# Patient Record
Sex: Female | Born: 1995 | Race: Black or African American | Hispanic: No | Marital: Single | Smoking: Never smoker
Health system: Southern US, Community
[De-identification: ages and names within clinical notes are randomized; demographics above are authoritative.]

## PROBLEM LIST (undated history)

## (undated) DIAGNOSIS — G43909 Migraine, unspecified, not intractable, without status migrainosus: Secondary | ICD-10-CM

---

## 2016-12-10 ENCOUNTER — Encounter (HOSPITAL_COMMUNITY): Payer: Self-pay | Admitting: Emergency Medicine

## 2016-12-10 DIAGNOSIS — Q283 Other malformations of cerebral vessels: Secondary | ICD-10-CM | POA: Insufficient documentation

## 2016-12-10 DIAGNOSIS — G43109 Migraine with aura, not intractable, without status migrainosus: Secondary | ICD-10-CM | POA: Diagnosis not present

## 2016-12-10 DIAGNOSIS — R200 Anesthesia of skin: Secondary | ICD-10-CM | POA: Insufficient documentation

## 2016-12-10 DIAGNOSIS — R51 Headache: Secondary | ICD-10-CM | POA: Diagnosis present

## 2016-12-10 NOTE — ED Triage Notes (Signed)
Pt states she has been having headaches since Tuesday off and on  Pt has hx of migraines and sees a neurologist  Pt states she has been having numbness and tingling in her left arm with this headache and she has never had that before

## 2016-12-11 ENCOUNTER — Emergency Department (HOSPITAL_COMMUNITY)
Admission: EM | Admit: 2016-12-11 | Discharge: 2016-12-11 | Disposition: A | Discharge status: Home / Self Care | Payer: Managed Care, Other (non HMO) | Attending: Emergency Medicine | Admitting: Emergency Medicine

## 2016-12-11 ENCOUNTER — Emergency Department (HOSPITAL_COMMUNITY): Payer: Managed Care, Other (non HMO)

## 2016-12-11 DIAGNOSIS — R200 Anesthesia of skin: Secondary | ICD-10-CM

## 2016-12-11 DIAGNOSIS — R519 Headache, unspecified: Secondary | ICD-10-CM

## 2016-12-11 DIAGNOSIS — R51 Headache: Secondary | ICD-10-CM

## 2016-12-11 DIAGNOSIS — Q283 Other malformations of cerebral vessels: Secondary | ICD-10-CM

## 2016-12-11 DIAGNOSIS — G43109 Migraine with aura, not intractable, without status migrainosus: Secondary | ICD-10-CM

## 2016-12-11 HISTORY — DX: Migraine, unspecified, not intractable, without status migrainosus: G43.909

## 2016-12-11 LAB — CBC WITH DIFFERENTIAL/PLATELET
BASOS PCT: 0 %
Basophils Absolute: 0 10*3/uL (ref 0.0–0.1)
EOS ABS: 0.1 10*3/uL (ref 0.0–0.7)
EOS PCT: 1 %
HCT: 35 % — ABNORMAL LOW (ref 36.0–46.0)
Hemoglobin: 12.5 g/dL (ref 12.0–15.0)
LYMPHS ABS: 4.6 10*3/uL — AB (ref 0.7–4.0)
Lymphocytes Relative: 43 %
MCH: 28.7 pg (ref 26.0–34.0)
MCHC: 35.7 g/dL (ref 30.0–36.0)
MCV: 80.3 fL (ref 78.0–100.0)
MONOS PCT: 7 %
Monocytes Absolute: 0.7 10*3/uL (ref 0.1–1.0)
Neutro Abs: 5.2 10*3/uL (ref 1.7–7.7)
Neutrophils Relative %: 49 %
PLATELETS: 234 10*3/uL (ref 150–400)
RBC: 4.36 MIL/uL (ref 3.87–5.11)
RDW: 13.9 % (ref 11.5–15.5)
WBC: 10.6 10*3/uL — ABNORMAL HIGH (ref 4.0–10.5)

## 2016-12-11 LAB — BASIC METABOLIC PANEL
Anion gap: 8 (ref 5–15)
BUN: 14 mg/dL (ref 6–20)
CALCIUM: 8.6 mg/dL — AB (ref 8.9–10.3)
CO2: 23 mmol/L (ref 22–32)
CREATININE: 0.76 mg/dL (ref 0.44–1.00)
Chloride: 106 mmol/L (ref 101–111)
Glucose, Bld: 77 mg/dL (ref 65–99)
Potassium: 3.8 mmol/L (ref 3.5–5.1)
SODIUM: 137 mmol/L (ref 135–145)

## 2016-12-11 LAB — POC URINE PREG, ED: Preg Test, Ur: NEGATIVE

## 2016-12-11 MED ORDER — SODIUM CHLORIDE 0.9 % IV BOLUS (SEPSIS)
1000.0000 mL | Freq: Once | INTRAVENOUS | Status: AC
  Administered 2016-12-11: 1000 mL via INTRAVENOUS

## 2016-12-11 MED ORDER — GADOBENATE DIMEGLUMINE 529 MG/ML IV SOLN
20.0000 mL | Freq: Once | INTRAVENOUS | Status: AC | PRN
  Administered 2016-12-11: 18 mL via INTRAVENOUS

## 2016-12-11 MED ORDER — DIPHENHYDRAMINE HCL 50 MG/ML IJ SOLN
25.0000 mg | Freq: Once | INTRAMUSCULAR | Status: AC
  Administered 2016-12-11: 25 mg via INTRAVENOUS
  Filled 2016-12-11: qty 1

## 2016-12-11 MED ORDER — METOCLOPRAMIDE HCL 5 MG/ML IJ SOLN
10.0000 mg | Freq: Once | INTRAMUSCULAR | Status: AC
  Administered 2016-12-11: 10 mg via INTRAVENOUS
  Filled 2016-12-11: qty 2

## 2016-12-11 NOTE — Discharge Instructions (Signed)
You were seen today for headache and numbness. Your MRI shows a benign cavernoma which is likely not related to your symptoms. Your symptoms likely are related to a complicated migraine. Follow-up with your neurologist.

## 2016-12-11 NOTE — ED Notes (Signed)
Pt taken to MRI  

## 2016-12-11 NOTE — ED Provider Notes (Signed)
  Patient presents as transfer for [facility] for MRI. Treated for complicated migraine but had some arm numbness. CT scan showed increased attenuation in deep white matter. Recommended MRI. On my evaluation, patient reports her headache is improved. Currently 4 out of 10. She is otherwise nontoxic-appearing.  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 Likely without clinical significance. I discussed this with the patient. Feel her symptoms are consistent with, gated migraine. She feels much better. Will discharge home with neurology follow-up.  Physical Exam  BP 130/76   Pulse 76   Temp 98.7 F (37.1 C)   Resp 18   Ht  (1.626 m)   Wt 89.1 kg (196 lb 6.4 oz)   SpO2 100%   BMI 33.71 kg/m   Physical Exam  ABC's intact, nontoxic-appearing   ED Course  Procedures        Shon Baton, MD 12/11/16 [phone]

## 2016-12-11 NOTE — ED Notes (Signed)
Patient picked up by [facility] and taken to [facility] ED.

## 2016-12-11 NOTE — ED Provider Notes (Signed)
TIME SEEN: 1:55 AM  CHIEF COMPLAINT: headache, left arm numbness  HPI: Pt is a 21 year old female with history of migraine headaches who presents emergency department with headache. She states she has had headache that started on Tuesday 12/04/16 and has been intermittent. Described as left-sided, throbbing with intermittent sharp pains that is typical of her migraines. She has photophobia and nausea. She takes sumatriptan and but has not had relief with this medication. She is followed by Dr. Theresia Majors with neurology ([facility] in [place]).  States she just recently had an appointment with him. She reports that on Friday 12/07/16 she began having tingling in her left arm that has been intermittent. It has been constant for the past 6 hours. She feels like this arm is numb. She has never had focal neurologic deficits with her migraines before. No family history of brain tumor, MS, strokes migraines. Mother does have a history of cluster headaches. She denies head injury. Not on antiplatelet or anticoagulants. No focal weakness. No vision changes. No changes in her speech.  ROS: See HPI Constitutional: no fever  Eyes: no drainage  ENT: no runny nose   Cardiovascular:  no chest pain  Resp: no SOB  GI: no vomiting GU: no dysuria Integumentary: no rash  Allergy: no hives  Musculoskeletal: no leg swelling  Neurological: no slurred speech ROS otherwise negative  PAST MEDICAL HISTORY/PAST SURGICAL HISTORY:  Past Medical History:  Diagnosis Date  . Migraine headache     MEDICATIONS:  Prior to Admission medications   Not on File    ALLERGIES:  No Known Allergies  SOCIAL HISTORY:  Social History  Substance Use Topics  . Smoking status: Never Smoker  . Smokeless tobacco: Never Used  . Alcohol use Yes     Comment: rare    FAMILY HISTORY: Family History  Problem Relation Age of Onset  . Hypertension Mother   . Migraines Mother   . Hypertension Father   . Hypertension Other      EXAM: BP 133/73 (BP Location: Right Arm)   Pulse 86   Temp 98.6 F (37 C) (Oral)   Resp 18   Ht  (1.626 m)   Wt 89.1 kg (196 lb 6.4 oz)   SpO2 100%   BMI 33.71 kg/m  CONSTITUTIONAL: Alert and oriented and responds appropriately to questions. Well-appearing; well-nourished HEAD: Normocephalic EYES: Conjunctivae clear, pupils appear equal, EOMI ENT: normal nose; moist mucous membranes NECK: Supple, no meningismus, no nuchal rigidity, no LAD  CARD: RRR; S1 and S2 appreciated; no murmurs, no clicks, no rubs, no gallops RESP: Normal chest excursion without splinting or tachypnea; breath sounds clear and equal bilaterally; no wheezes, no rhonchi, no rales, no hypoxia or respiratory distress, speaking full sentences ABD/GI: Normal bowel sounds; non-distended; soft, non-tender, no rebound, no guarding, no peritoneal signs, no hepatosplenomegaly BACK:  The back appears normal and is non-tender to palpation, there is no CVA tenderness EXT: Normal ROM in all joints; non-tender to palpation; no edema; normal capillary refill; no cyanosis, no calf tenderness or swelling, compartment soft, 2+ radial pulses bilaterally    SKIN: Normal color for age and race; warm; no rash NEURO: Moves all extremities equally, strength 5/5 in all 4 services, no dysmetria to finger-nose testing bilaterally, cranial nerves II through XII intact, normal speech, diminished sensation throughout the entire left arm compared to the right, normal sensation in both legs PSYCH: The patient's mood and manner are appropriate. Grooming and personal hygiene are appropriate.  MEDICAL DECISION MAKING:  Pt here with what is likely a complicated migraine. Will treat with Reglan, Benadryl and IV fluids. Given new neurologic deficits will obtain a head CT. Low suspicion for intracranial hemorrhage. Differential does include stroke and mass.  Not a TPA candidate given her NIH stroke scale would only be 1 and she has had symptoms for  over 6 hours.  ED PROGRESS: Pt reports headache and numbness improving after Reglan, Benadryl IV fluids. Head CT shows focus of vague increased attenuation in the deep white matter along the posterior horn of the left lateral ventricle. Recommend MRI for further evaluation. Patient will need to be transferred to [facility]. She is comfortable with this plan.    3:00 AM  D/w radiologist Dr. Andria Meuse.  He recommends an MRI of her brain with and without contrast. He states he does not think this is hemorrhage but could be a mass or infarct.   3:10 AM  D/w Dr. Wilkie Aye, EDP at [facility].  She agrees to accept patient in transfer. If MRI is negative, patient will be discharged home with follow-up with her neurologist at [facility] in [place].   I reviewed all nursing notes, vitals, pertinent previous records, EKGs, lab and urine results, imaging (as available).     Lael Pilch, Layla Maw, DO 12/11/16 [phone]

## 2016-12-11 NOTE — ED Notes (Signed)
Patient states she went to see her neurologist on Friday and had numbness going down her left arm. Patient states that neurologist stated if it comes back that she needed to come to the ED. The patient stated it came back today.

## 2016-12-11 NOTE — ED Notes (Signed)
Pt arrived by [facility] for MRI. Pt reports numbness in L arm, headache has improved with medication.

## 2017-03-05 ENCOUNTER — Other Ambulatory Visit: Payer: Self-pay

## 2017-03-05 ENCOUNTER — Encounter (HOSPITAL_COMMUNITY): Payer: Self-pay | Admitting: Emergency Medicine

## 2017-03-05 ENCOUNTER — Ambulatory Visit (HOSPITAL_COMMUNITY)
Admission: EM | Admit: 2017-03-05 | Discharge: 2017-03-05 | Disposition: A | Discharge status: Home / Self Care | Payer: Managed Care, Other (non HMO) | Attending: Internal Medicine | Admitting: Internal Medicine

## 2017-03-05 DIAGNOSIS — J029 Acute pharyngitis, unspecified: Secondary | ICD-10-CM

## 2017-03-05 DIAGNOSIS — R51 Headache: Secondary | ICD-10-CM

## 2017-03-05 DIAGNOSIS — J069 Acute upper respiratory infection, unspecified: Secondary | ICD-10-CM | POA: Diagnosis not present

## 2017-03-05 MED ORDER — KETOROLAC TROMETHAMINE 60 MG/2ML IM SOLN
60.0000 mg | Freq: Once | INTRAMUSCULAR | Status: AC
  Administered 2017-03-05: 60 mg via INTRAMUSCULAR

## 2017-03-05 MED ORDER — KETOROLAC TROMETHAMINE 60 MG/2ML IM SOLN
INTRAMUSCULAR | Status: AC
  Filled 2017-03-05: qty 2

## 2017-03-05 NOTE — Discharge Instructions (Signed)
Push fluids to ensure adequate hydration and keep secretions thin.  Tylenol and/or ibuprofen as needed for pain or fevers. Do not take additional ibuprofen until approximately 8:30 tonight. Continue with over the counter treatments such as throat lozenges, cold liquids, honey tea etc as needed for sore throat. If symptoms worsen or do not improve in the next week to return to be seen or to follow up with PCP.

## 2017-03-05 NOTE — ED Triage Notes (Signed)
Pt c/o flu like symptoms body aches, HA, sore throat for a couple days,

## 2017-03-05 NOTE — ED Provider Notes (Signed)
[facility]    CSN: [id number] Arrival date & time: 03/05/17  1344     History   Chief Complaint Chief Complaint  Patient presents with  . Generalized Body Aches    HPI Deborah Valenzuela is a 22 y.o. female.   Deborah Valenzuela presents with complaints of body ahces, sore throat and headache which started two days ago. Without fevers. Mild cough. Without runny nose, ear pain, gi/gu complaints. No known ill contacts. Pain is 7/ 10. Took tylenol two days ago which did not help. Did not get her flu shot this year. Without rash. History migraines, without other medical history.   ROS per HPI.       Past Medical History:  Diagnosis Date  . Migraine headache     There are no active problems to display for this patient.   History reviewed. No pertinent surgical history.  OB History    No data available       Home Medications    Prior to Admission medications   Medication Sig Start Date End Date Taking? Authorizing Provider  etonogestrel (IMPLANON) 68 MG IMPL implant 1 each by Subdermal route once.    [provider]  ondansetron (ZOFRAN) 4 MG tablet Take 1-2 tablets by mouth every 8 (eight) hours as needed for nausea.  12/03/16   [provider]  SUMAtriptan (IMITREX) 50 MG tablet Take 1 tablet by mouth every 2 (two) hours as needed for migraine.  12/03/16   [provider]    Family History Family History  Problem Relation Age of Onset  . Hypertension Mother   . Migraines Mother   . Hypertension Father   . Hypertension Other     Social History Social History   Tobacco Use  . Smoking status: Never Smoker  . Smokeless tobacco: Never Used  Substance Use Topics  . Alcohol use: Yes    Comment: rare  . Drug use: No     Allergies   Patient has no known allergies.   Review of Systems Review of Systems   Physical Exam Triage Vital Signs ED Triage Vitals  Enc Vitals Group     BP 03/05/17 1354 140/73     Pulse Rate 03/05/17  1354 100     Resp 03/05/17 1354 18     Temp 03/05/17 1354 98.4 F (36.9 C)     Temp src --      SpO2 03/05/17 1354 100 %     Weight --      Height --      Head Circumference --      Peak Flow --      Pain Score 03/05/17 1355 7     Pain Loc --      Pain Edu? --      Excl. in GC? --    No data found.  Updated Vital Signs BP 140/73   Pulse 100   Temp 98.4 F (36.9 C)   Resp 18   SpO2 100%   Visual Acuity Right Eye Distance:   Left Eye Distance:   Bilateral Distance:    Right Eye Near:   Left Eye Near:    Bilateral Near:     Physical Exam  Constitutional: She is oriented to person, place, and time. She appears well-developed and well-nourished. No distress.  HENT:  Head: Normocephalic and atraumatic.  Right Ear: Tympanic membrane, external ear and ear canal normal.  Left Ear: Tympanic membrane, external ear and ear canal normal.  Nose:  Nose normal.  Mouth/Throat: Uvula is midline, oropharynx is clear and moist and mucous membranes are normal. No tonsillar exudate.  Eyes: Conjunctivae and EOM are normal. Pupils are equal, round, and reactive to light.  Cardiovascular: Normal rate, regular rhythm and normal heart sounds.  Pulmonary/Chest: Effort normal and breath sounds normal.  Neurological: She is alert and oriented to person, place, and time.  Skin: Skin is warm and dry.     UC Treatments / Results  Labs (all labs ordered are listed, but only abnormal results are displayed) Labs Reviewed - No data to display  EKG  EKG Interpretation None       Radiology No results found.  Procedures Procedures (including critical care time)  Medications Ordered in UC Medications  ketorolac (TORADOL) injection 60 mg (not administered)     Initial Impression / Assessment and Plan / UC Course  I have reviewed the triage vital signs and the nursing notes.  Pertinent labs & imaging results that were available during my care of the patient were reviewed by me and  considered in my medical decision making (see chart for details).     Without acute findings on physical exam. Non toxic in appearance. Vitals stable. Continue with supportive cares for likely viral illness. toradol given in clinic today for pain. If symptoms worsen or do not improve in the next week to return to be seen or to follow up with PCP. Patient verbalized understanding and agreeable to plan.    Final Clinical Impressions(s) / UC Diagnoses   Final diagnoses:  Viral upper respiratory tract infection    ED Discharge Orders    None       Controlled Substance Prescriptions [place] Controlled Substance Registry consulted? Not Applicable   Georgetta Haber, NP 03/05/17 1417

## 2018-08-23 IMAGING — CT CT HEAD W/O CM
3 of 4 series · 15 of 47 positions shown, 18 images · non-contrast
Comparison: None.

ADDENDUM:
Results were discussed with Dr. Lefkowitz following the procedure with
recommendation for MRI with and without contrast material.
CLINICAL DATA: Left-sided headache for 1 week. Numbness and
tingling in the left arm.

EXAM:
CT HEAD WITHOUT CONTRAST
TECHNIQUE: Contiguous axial images were obtained from the base of the skull
through the vertex without intravenous contrast.

[Series 2: head w/o · axial · non-contrast · 0.45mm/px · z∈[-207,-72]mm · 9 of 33 slices shown, 12 images]
[im 3/33  brain]
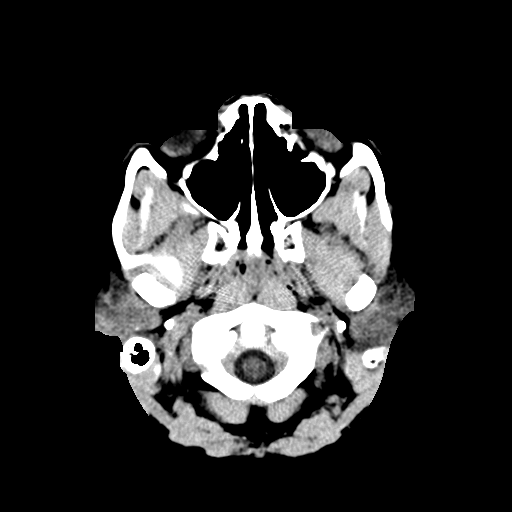
[im 3/33  bone]
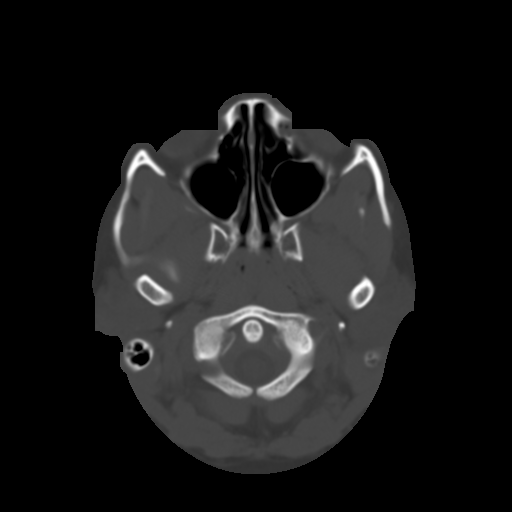
[im 7/33  brain]
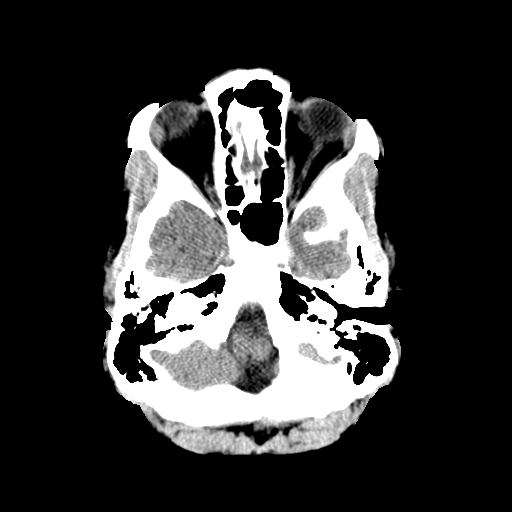
[im 10/33  brain]
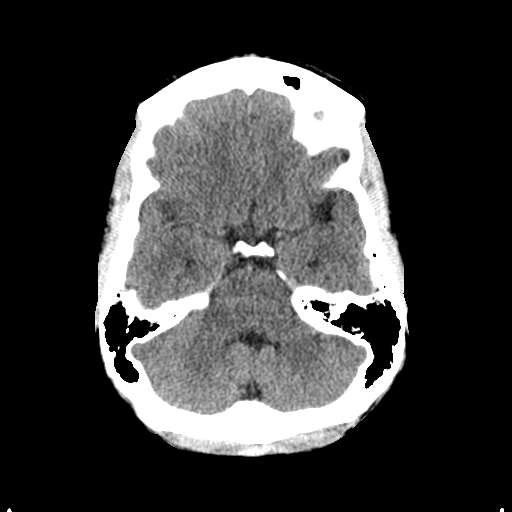
[im 14/33  brain]
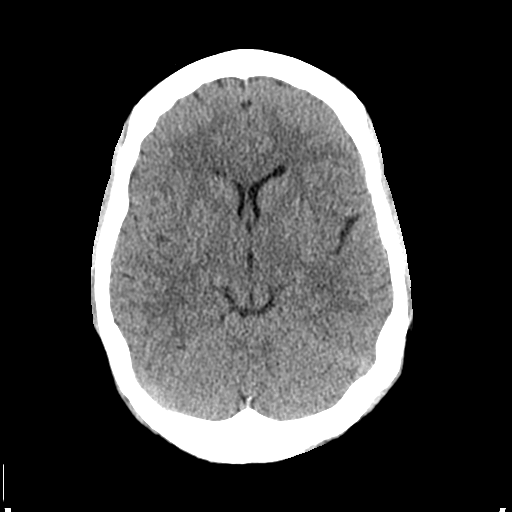
[im 17/33  brain]
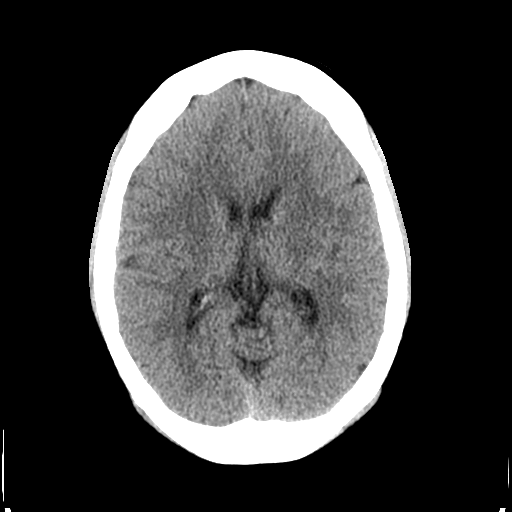
[im 17/33  bone]
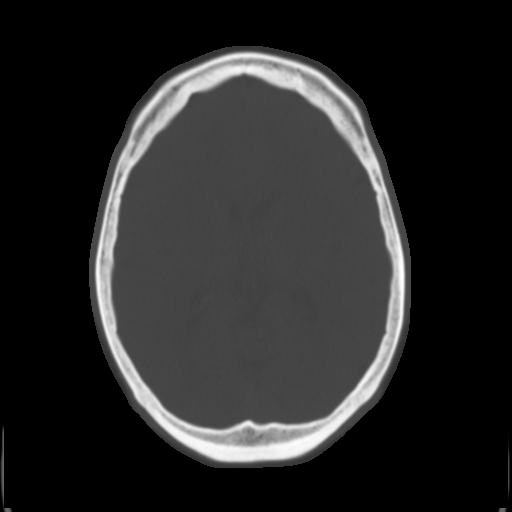
[im 19/33  brain]
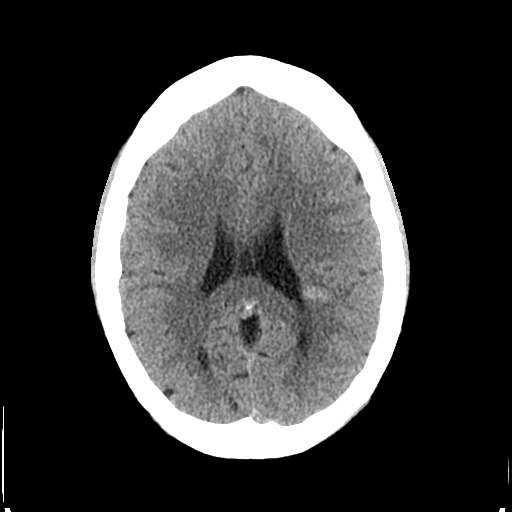
[im 23/33  brain]
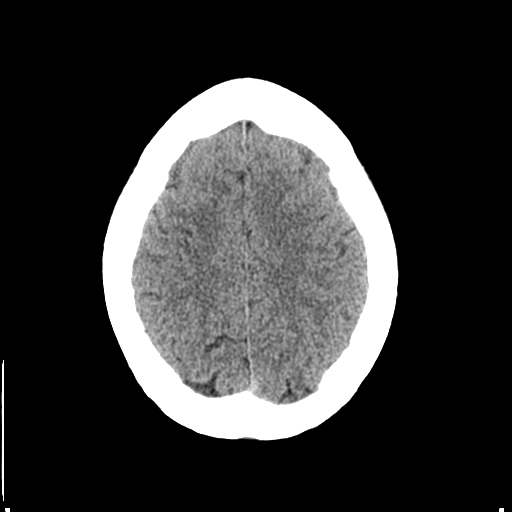
[im 26/33  brain]
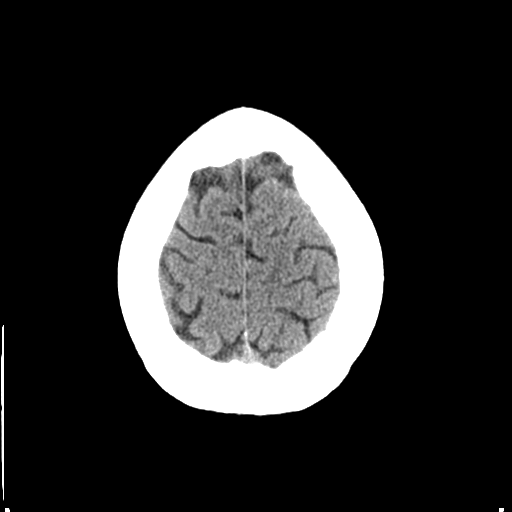
[im 30/33  brain]
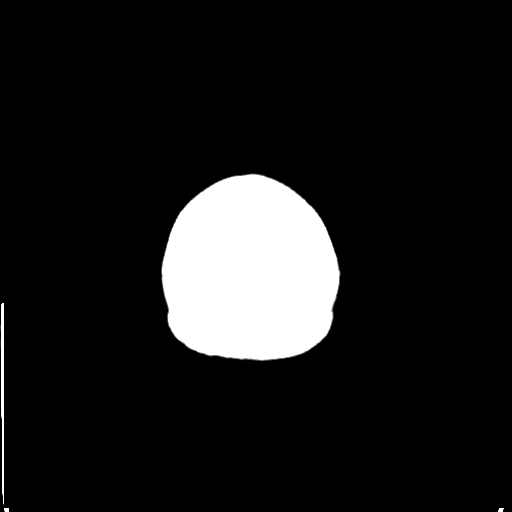
[im 30/33  bone]
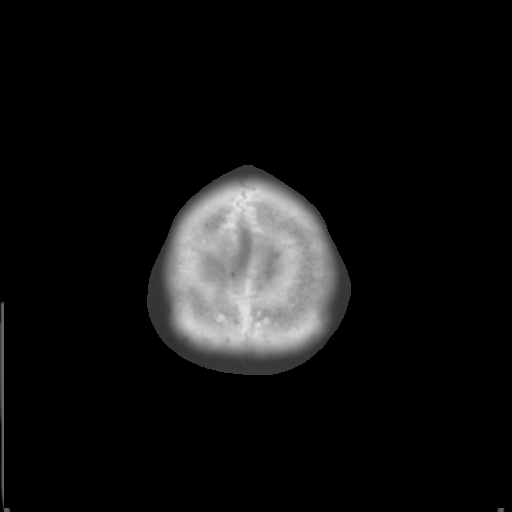

[Series 4: coronal · coronal · 0.30mm/px · 3 of 65 slices shown]
[im 22/65  brain]
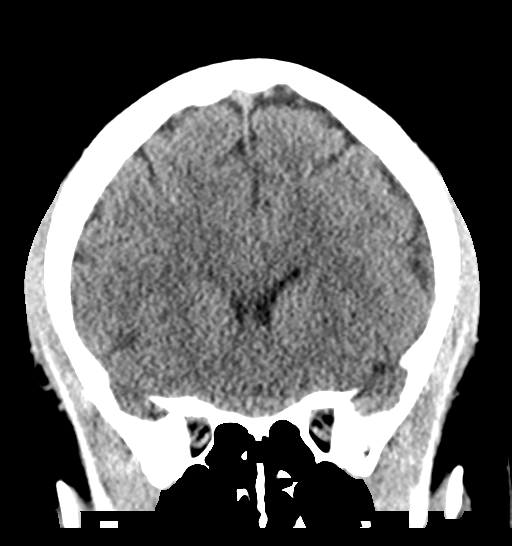
[im 29/65  brain]
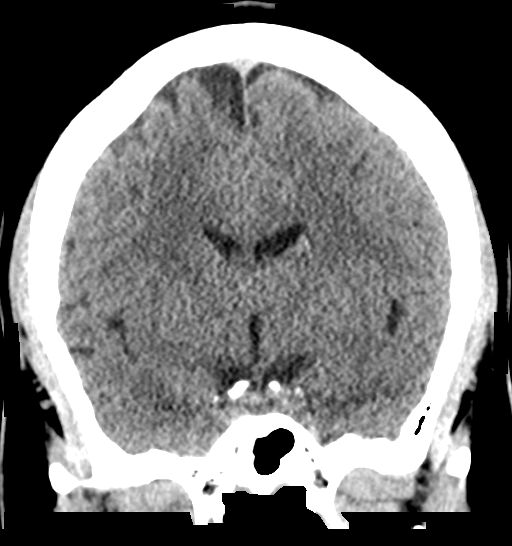
[im 36/65  brain]
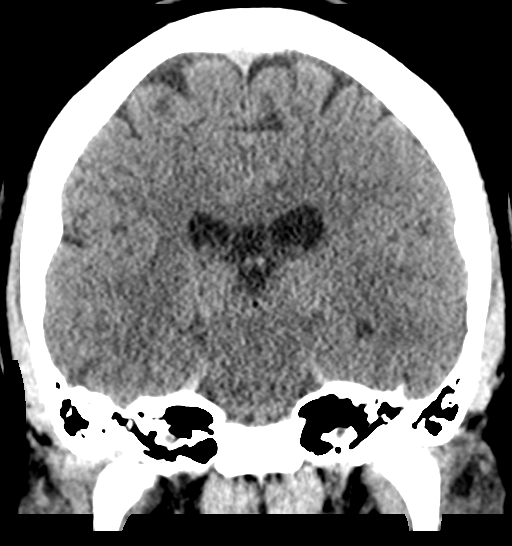

[Series 5: sagittal · sagittal · 0.32mm/px · 3 of 49 slices shown]
[im 17/49  brain]
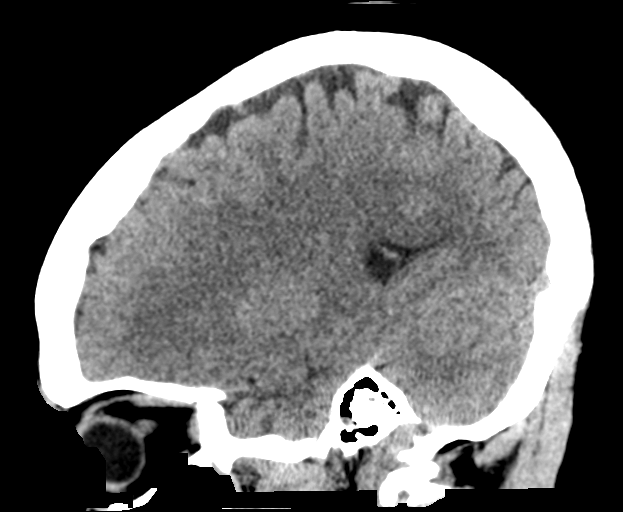
[im 25/49  brain]
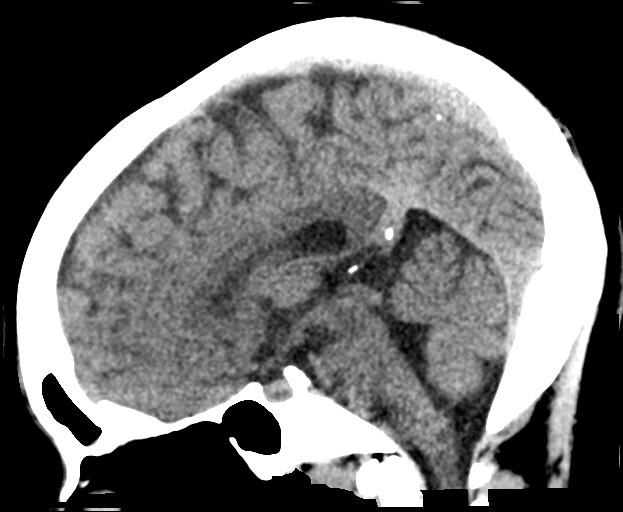
[im 33/49  brain]
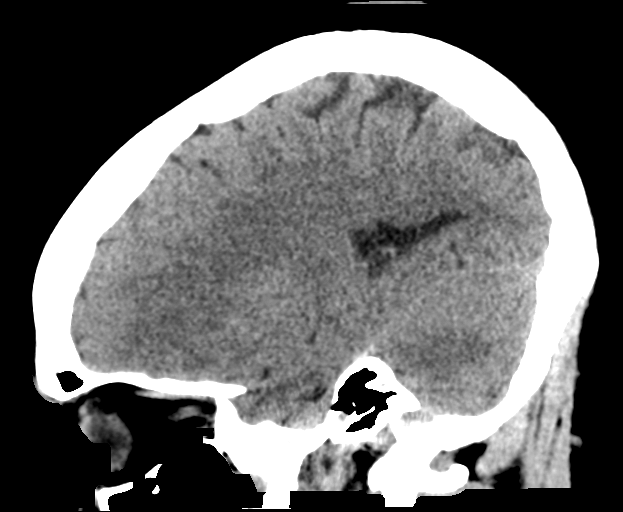

[15 of 47 positions shown; findings below may reference images not displayed]

FINDINGS: Brain: There is a small focal area of mild hyperintensity along the
deep white matter adjacent to the left posterior horn of the lateral
ventricle. This could represent a focal area of gliosis. Infarct,
AVM, or mass not excluded. MRI suggested for further evaluation.
There is no significant mass effect or midline shift. Gray-white
matter junctions are distinct. Basal cisterns are not effaced. No
abnormal extra-axial fluid collections. No acute intracranial
hemorrhage. No ventricular dilatation. Cavum septum vergae.

Vascular: No hyperdense vessel or unexpected calcification.

Skull: Normal. Negative for fracture or focal lesion.

Sinuses/Orbits: No acute finding.

Other: Congenital nonunion of the posterior arch of C1.
IMPRESSION: Focus of vague increased attenuation in the deep white matter along
the posterior horn of the left lateral ventricle. This may represent
focal gliosis and an infarct, mass lesion or AVM is not excluded.
MRI is recommended for further evaluation.
